# Patient Record
Sex: Female | Born: 1980 | Race: Black or African American | Hispanic: No | Marital: Single | Smoking: Never smoker
Health system: Southern US, Community
[De-identification: ages and names within clinical notes are randomized; demographics above are authoritative.]

## PROBLEM LIST (undated history)

## (undated) DIAGNOSIS — E282 Polycystic ovarian syndrome: Secondary | ICD-10-CM

---

## 2015-05-10 ENCOUNTER — Emergency Department (HOSPITAL_BASED_OUTPATIENT_CLINIC_OR_DEPARTMENT_OTHER): Payer: Managed Care, Other (non HMO)

## 2015-05-10 ENCOUNTER — Emergency Department (HOSPITAL_BASED_OUTPATIENT_CLINIC_OR_DEPARTMENT_OTHER)
Admission: EM | Admit: 2015-05-10 | Discharge: 2015-05-10 | Disposition: A | Discharge status: Home / Self Care | Payer: Managed Care, Other (non HMO) | Attending: Emergency Medicine | Admitting: Emergency Medicine

## 2015-05-10 ENCOUNTER — Encounter (HOSPITAL_BASED_OUTPATIENT_CLINIC_OR_DEPARTMENT_OTHER): Payer: Self-pay

## 2015-05-10 DIAGNOSIS — S6991XA Unspecified injury of right wrist, hand and finger(s), initial encounter: Secondary | ICD-10-CM | POA: Diagnosis present

## 2015-05-10 DIAGNOSIS — Y9389 Activity, other specified: Secondary | ICD-10-CM | POA: Diagnosis not present

## 2015-05-10 DIAGNOSIS — Y998 Other external cause status: Secondary | ICD-10-CM | POA: Diagnosis not present

## 2015-05-10 DIAGNOSIS — W228XXA Striking against or struck by other objects, initial encounter: Secondary | ICD-10-CM | POA: Diagnosis not present

## 2015-05-10 DIAGNOSIS — S63616A Unspecified sprain of right little finger, initial encounter: Secondary | ICD-10-CM | POA: Insufficient documentation

## 2015-05-10 DIAGNOSIS — S63619A Unspecified sprain of unspecified finger, initial encounter: Secondary | ICD-10-CM

## 2015-05-10 DIAGNOSIS — Y9289 Other specified places as the place of occurrence of the external cause: Secondary | ICD-10-CM | POA: Diagnosis not present

## 2015-05-10 NOTE — ED Provider Notes (Signed)
CSN: [id number]     Arrival date & time 05/10/15  2149 History   First MD Initiated Contact with Patient 05/10/15 2246     Chief Complaint  Patient presents with  . Finger Injury     (Consider location/radiation/quality/duration/timing/severity/associated sxs/prior Treatment) HPI Comments: Patient presents with right fifth digit injury. Patient states that she sustained the injury approximately 5 weeks ago. She struck her finger on a desk and bent it sideways into extreme abduction. Since that time she has had pain with adduction of her finger. She also notices that her fifth finger drifts away from the other 4 digits in a position of comfort. This made it more difficult for her to work. No treatments prior to arrival. No other evaluation undertaken. No numbness or tingling. Onset acute. Course is constant.  The history is provided by the patient.    History reviewed. No pertinent past medical history. History reviewed. No pertinent past surgical history. No family history on file. Social History  Substance Use Topics  . Smoking status: Never Smoker   . Smokeless tobacco: None  . Alcohol Use: Yes     Comment: soical   OB History    No data available     Review of Systems  Constitutional: Negative for activity change.  Musculoskeletal: Positive for arthralgias. Negative for back pain, joint swelling and neck pain.  Skin: Negative for wound.  Neurological: Positive for weakness. Negative for numbness.      Allergies  Review of patient's allergies indicates no known allergies.  Home Medications   Prior to Admission medications   Not on File   BP 129/84 mmHg  Pulse 77  Temp(Src) 98.9 F (37.2 C)  Ht 5' (1.524 m)  Wt 189 lb (85.73 kg)  BMI 36.91 kg/m2  SpO2 100%  LMP 05/10/2015   Physical Exam  Constitutional: She appears well-developed and well-nourished.  HENT:  Head: Normocephalic and atraumatic.  Eyes: Pupils are equal, round, and reactive to light.  Neck:  Normal range of motion. Neck supple.  Cardiovascular: Exam reveals no decreased pulses.   Musculoskeletal: She exhibits tenderness. She exhibits no edema.       Right wrist: Normal. She exhibits normal range of motion and no tenderness.       Right forearm: Normal.       Right hand: She exhibits tenderness. She exhibits normal range of motion, normal capillary refill, no deformity and no swelling. Decreased strength noted. She exhibits finger abduction.       Hands: Neurological: She is alert. No sensory deficit.  Motor, sensation, and vascular distal to the injury is fully intact.   Skin: Skin is warm and dry.  Psychiatric: She has a normal mood and affect.  Nursing note and vitals reviewed.   ED Course  Procedures (including critical care time) Labs Review Labs Reviewed - No data to display  Imaging Review Dg Finger Little Right  05/10/2015   CLINICAL DATA:  Right little finger pain after injury. Hit pinky finger on corner of a table at work.  EXAM: RIGHT LITTLE FINGER 2+V  COMPARISON:  None.  FINDINGS: No fracture or dislocation. The alignment and joint spaces are maintained. No focal soft tissue abnormality.  IMPRESSION: Negative radiographs of the right little finger.   Electronically Signed   By: Rubye Oaks M.D.   On: 05/10/2015 23:07   I have personally reviewed and evaluated these images and lab results as part of my medical decision-making.   EKG Interpretation None  11:24 PM Patient seen and examined.   Vital signs reviewed and are as follows: BP 129/84 mmHg  Pulse 77  Temp(Src) 98.9 F (37.2 C)  Ht 5' (1.524 m)  Wt 189 lb (85.73 kg)  BMI 36.91 kg/m2  SpO2 100%  LMP 05/10/2015  Patient provided with finger splint. Encouraged use of NSAIDs. Hand follow-up given and encouraged.   MDM   Final diagnoses:  Finger sprain, initial encounter    Patient with finger sprain, likely ligament injury. She does have full range of motion of her finger but with  weakness with forced adduction. Orthopedic hand follow-up indicated at this point for definitive diagnosis and management. Fingers is neurovascularly intact.  Renne Crigler, PA-C 05/10/15 2355  Benjiman Core, MD 05/11/15 [phone]

## 2015-05-10 NOTE — ED Notes (Signed)
Pt states hit her rt pinky on a corner of a table at work; no swelling or deformity noted

## 2015-05-10 NOTE — Discharge Instructions (Signed)
Please read and follow all provided instructions.  Your diagnoses today include:  1. Finger sprain, initial encounter     Tests performed today include:  An x-ray of the affected area - does NOT show any broken bones  Vital signs. See below for your results today.   Medications recommended:   Naproxen - anti-inflammatory pain medication  Do not exceed  naproxen every 12 hours, take with food  You have been prescribed an anti-inflammatory medication or NSAID. Take with food. Take smallest effective dose for the shortest duration needed for your pain. Stop taking if you experience stomach pain or vomiting.   Take any prescribed medications only as directed.  Home care instructions:   Follow any educational materials contained in this packet  Follow R.I.C.E. Protocol:  R - rest your injury   I  - use ice on injury without applying directly to skin  C - compress injury with bandage or splint  E - elevate the injury as much as possible  Follow-up instructions: Please follow-up with your primary care provider or the provided orthopedic physician (bone specialist) if you continue to have significant pain in 1 week. In this case you may have a more severe injury that requires further care.   Return instructions:   Please return if your fingers are numb or tingling, appear gray or blue, or you have severe pain (also elevate the arm and loosen splint or wrap if you were given one)  Please return to the Emergency Department if you experience worsening symptoms.   Please return if you have any other emergent concerns.  Additional Information:  Your vital signs today were: BP 129/84 mmHg   Pulse 77   Temp(Src) 98.9 F (37.2 C)   Ht 5' (1.524 m)   Wt 189 lb (85.73 kg)   BMI 36.91 kg/m2   SpO2 100%   LMP 05/10/2015 If your blood pressure (BP) was elevated above 135/85 this visit, please have this repeated by your doctor within one month. --------------

## 2016-04-21 IMAGING — DX DG FINGER LITTLE 2+V*R*
3 series · 3 of 3 positions shown · non-contrast
Comparison: None.

CLINICAL DATA: Right little finger pain after injury. Hit pinky
finger on corner of a table at work.

EXAM:
RIGHT LITTLE FINGER 2+V

[finger ap]
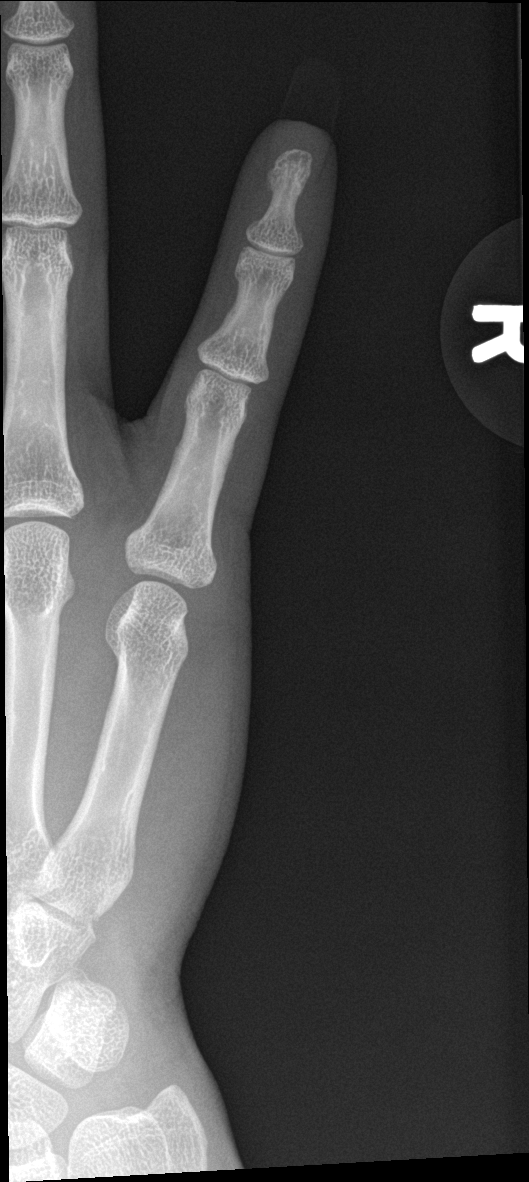

[finger obl]
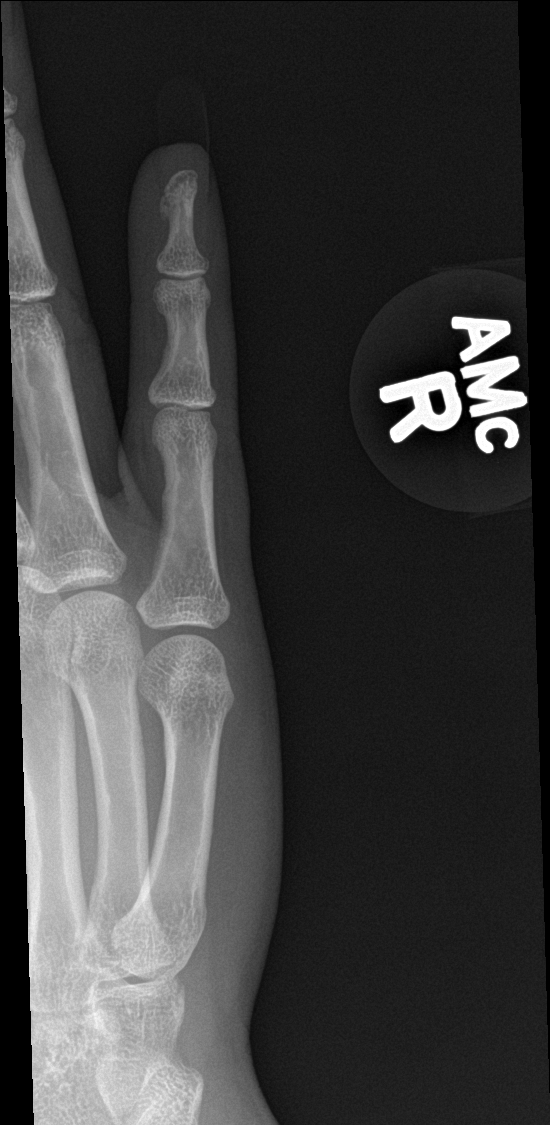

[finger lat]
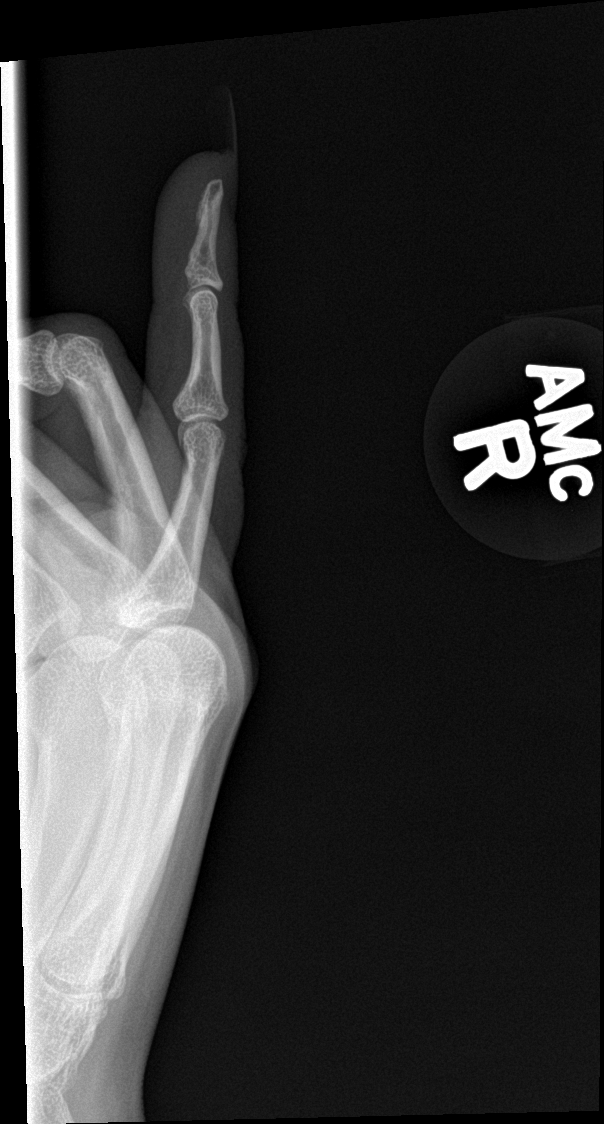

[3 of 3 positions shown; findings below may reference images not displayed]

FINDINGS: No fracture or dislocation. The alignment and joint spaces are
maintained. No focal soft tissue abnormality.
IMPRESSION: Negative radiographs of the right little finger.

## 2016-09-17 ENCOUNTER — Encounter (HOSPITAL_BASED_OUTPATIENT_CLINIC_OR_DEPARTMENT_OTHER): Payer: Self-pay | Admitting: Emergency Medicine

## 2016-09-17 ENCOUNTER — Emergency Department (HOSPITAL_BASED_OUTPATIENT_CLINIC_OR_DEPARTMENT_OTHER)
Admission: EM | Admit: 2016-09-17 | Discharge: 2016-09-17 | Disposition: A | Discharge status: Home / Self Care | Payer: Managed Care, Other (non HMO) | Attending: Emergency Medicine | Admitting: Emergency Medicine

## 2016-09-17 DIAGNOSIS — J069 Acute upper respiratory infection, unspecified: Secondary | ICD-10-CM | POA: Diagnosis not present

## 2016-09-17 DIAGNOSIS — R05 Cough: Secondary | ICD-10-CM | POA: Diagnosis present

## 2016-09-17 DIAGNOSIS — B9789 Other viral agents as the cause of diseases classified elsewhere: Secondary | ICD-10-CM

## 2016-09-17 HISTORY — DX: Polycystic ovarian syndrome: E28.2

## 2016-09-17 MED ORDER — BENZONATATE 100 MG PO CAPS: 100.0000 mg | ORAL_CAPSULE | Freq: Three times a day (TID) | ORAL | 0 refills | Status: AC | PRN

## 2016-09-17 NOTE — ED Provider Notes (Signed)
[facility]   CSN: [id number] Arrival date & time: 09/17/16  0003     History   Chief Complaint Chief Complaint  Patient presents with  . Cough    HPI Jodi Rich is a 36 y.o. female.  HPI  36 year old female with a history of PCOS presents with cough since 8 PM tonight. She was laying in bed when it started. It is a dry cough. She feels short of breath while coughing and also feels like she's about to throw up. She has not had any vomiting. Feels like her chest is congested currently. Has had some nasal congestion and sneezing starting at 6 PM tonight. Before all this she felt fine. No fevers. No sore throat, headache, or abdominal pain. Does not smoke. No history of lung disease such as asthma. She has not taken anything for all of these symptoms. Sitting up makes her symptoms much better.  Past Medical History:  Diagnosis Date  . PCOS (polycystic ovarian syndrome)     There are no active problems to display for this patient.   History reviewed. No pertinent surgical history.  OB History    No data available       Home Medications    Prior to Admission medications   Medication Sig Start Date End Date Taking? Authorizing Provider  levonorgestrel-ethinyl estradiol (VIENVA) 0.1-20 MG-MCG tablet Take 1 tablet by mouth daily.   Yes Historical Provider, MD    Family History No family history on file.  Social History Social History  Substance Use Topics  . Smoking status: Never Smoker  . Smokeless tobacco: Never Used  . Alcohol use Yes     Comment: soical     Allergies   Patient has no known allergies.   Review of Systems Review of Systems  Constitutional: Negative for fever.  HENT: Positive for congestion, sinus pressure and sneezing. Negative for sore throat.   Respiratory: Positive for cough and shortness of breath.   Gastrointestinal: Negative for vomiting.  Neurological: Negative for headaches.  All other systems  reviewed and are negative.    Physical Exam Updated Vital Signs BP 121/76 (BP Location: Right Arm)   Pulse 74   Temp 98.5 F (36.9 C) (Oral)   Resp 18   Ht 5' (1.524 m)   Wt 191 lb (86.6 kg)   SpO2 99%   BMI 37.30 kg/m   Physical Exam  Constitutional: She is oriented to person, place, and time. She appears well-developed and well-nourished. No distress.  HENT:  Head: Normocephalic and atraumatic.  Right Ear: External ear normal.  Left Ear: External ear normal.  Nose: Nose normal.  Mouth/Throat: Oropharynx is clear and moist.  Eyes: Right eye exhibits no discharge. Left eye exhibits no discharge.  Cardiovascular: Normal rate, regular rhythm and normal heart sounds.   Pulmonary/Chest: Effort normal and breath sounds normal. She has no wheezes. She has no rales.  Abdominal: Soft. There is no tenderness.  Neurological: She is alert and oriented to person, place, and time.  Skin: Skin is warm and dry. She is not diaphoretic.  Nursing note and vitals reviewed.    ED Treatments / Results  Labs (all labs ordered are listed, but only abnormal results are displayed) Labs Reviewed - No data to display  EKG  EKG Interpretation None       Radiology No results found.  Procedures Procedures (including critical care time)  Medications Ordered in ED Medications - No data to display   Initial  Impression / Assessment and Plan / ED Course  I have reviewed the triage vital signs and the nursing notes.  Pertinent labs & imaging results that were available during my care of the patient were reviewed by me and considered in my medical decision making (see chart for details).     Patient is overall well appearing. Acute cough starting this PM, no focal findings such as hypoxia, tachypnea or distress. No rales or other acute findings to suggest PNA. Discussed symptomatic treatment and will give tessalon for cough. Recommend humidifiers at night, sleeping up on pillows. Strict  return precautions. This is likely the beginning of a viral URI  Final Clinical Impressions(s) / ED Diagnoses   Final diagnoses:  Viral URI with cough    New Prescriptions New Prescriptions   BENZONATATE (TESSALON) 100 MG CAPSULE    Take 1 capsule (100 mg total) by mouth 3 (three) times daily as needed for cough.     Pricilla LovelessScott Lea Baine, MD 09/17/16 [phone]

## 2016-09-17 NOTE — ED Notes (Signed)
ED Provider at bedside. 

## 2016-09-17 NOTE — ED Triage Notes (Signed)
Pt reports cough that started at 8pm
